# Patient Record
Sex: Female | Born: 1960 | Race: Black or African American | Hispanic: No | Marital: Single | Smoking: Current every day smoker
Health system: Southern US, Community
[De-identification: ages and names within clinical notes are randomized; demographics above are authoritative.]

## PROBLEM LIST (undated history)

## (undated) DIAGNOSIS — I10 Essential (primary) hypertension: Secondary | ICD-10-CM

---

## 2000-09-09 ENCOUNTER — Encounter: Payer: Self-pay | Admitting: Emergency Medicine

## 2000-09-09 ENCOUNTER — Emergency Department (HOSPITAL_COMMUNITY): Admission: EM | Admit: 2000-09-09 | Discharge: 2000-09-09 | Payer: Self-pay | Admitting: Emergency Medicine

## 2001-09-12 ENCOUNTER — Ambulatory Visit (HOSPITAL_COMMUNITY): Admission: RE | Admit: 2001-09-12 | Discharge: 2001-09-12 | Payer: Self-pay | Admitting: Obstetrics

## 2001-10-17 ENCOUNTER — Ambulatory Visit (HOSPITAL_COMMUNITY): Admission: RE | Admit: 2001-10-17 | Discharge: 2001-10-17 | Payer: Self-pay | Admitting: Obstetrics & Gynecology

## 2001-11-05 ENCOUNTER — Inpatient Hospital Stay (HOSPITAL_COMMUNITY): Admission: AD | Admit: 2001-11-05 | Discharge: 2001-11-08 | Payer: Self-pay | Admitting: *Deleted

## 2001-11-05 ENCOUNTER — Encounter (INDEPENDENT_AMBULATORY_CARE_PROVIDER_SITE_OTHER): Payer: Self-pay | Admitting: Specialist

## 2003-09-14 ENCOUNTER — Encounter: Admission: RE | Admit: 2003-09-14 | Discharge: 2003-09-14 | Payer: Self-pay | Admitting: Family Medicine

## 2006-01-22 ENCOUNTER — Emergency Department (HOSPITAL_COMMUNITY): Admission: EM | Admit: 2006-01-22 | Discharge: 2006-01-22 | Payer: Self-pay | Admitting: *Deleted

## 2006-01-29 ENCOUNTER — Emergency Department (HOSPITAL_COMMUNITY): Admission: EM | Admit: 2006-01-29 | Discharge: 2006-01-29 | Payer: Self-pay | Admitting: Emergency Medicine

## 2008-06-15 ENCOUNTER — Emergency Department (HOSPITAL_COMMUNITY): Admission: EM | Admit: 2008-06-15 | Discharge: 2008-06-15 | Payer: Self-pay | Admitting: Family Medicine

## 2011-02-13 NOTE — Discharge Summary (Signed)
[facility]  Patient:    Darlene Taylor, Darlene Taylor Visit Number: [id number] MRN: [id number]          Service Type: OBS Location: 910A 9133 01 Attending Physician:  Michaelle Copas Dictated by:   Lucille Passy, M.D. Admit Date:  11/05/2001 Discharge Date: 11/08/2001                             Discharge Summary  DISCHARGE DIAGNOSES:          1. Repeat cesarean section.                               2. Advanced maternal age.                               3. Mild elevation of blood pressures.                               4. Status post bilateral tubal ligation.  DISCHARGE MEDICATIONS:        1. Ibuprofen 600 mg p.o. q.6h. p.r.n. pain.                               2. Percocet 5/325 mg 1-2 tablets p.o. q.6h.                                  p.r.n. pain.                               3. Prenatal vitamins one p.o. q.d. while                                  breast-feeding.                               4. Pepcid 20 mg p.o. b.i.d.  DISCHARGE INSTRUCTIONS:       1. Pelvic rest for six weeks.                               2. No heavy lifting.                               3. Staples removed by [facility] nurse, 2-3 days following discharge.                               4. Patient to follow up at [facility] in six weeks for routine postpartum check.                               5. Patient encouraged to return for increasing pain, bleeding, fever or for headache, vision changes or worsening stomach pain; as the pain had mildly elevated blood pressures during hospitalization.  BRIEF  HOSPITAL COURSE:        [name] is a 50 year old G5, P1-0-3-1 at 37-0 weeks, who was admitted with SROM and uterine contractions.  She underwent a repeat cesarean section on November 05, 2001 by Dr. Shearon Balo (see dictated operative report).  The patient delivered a viable female infant at [phone] on that day, with Apgars 9 at one minute and 9 at five  minutes.  She also underwent bilateral tubal ligation procedure at that time.  The patient tolerated the procedure well.  Estimated blood loss 800 cc.  Anesthesia was spinal.  The patient was breast-feeding the infant with no problems at the time of discharge.  Hemoglobin was 10.6 on postoperative day #1.  The patient was discharged on postoperative day #3.  Blood pressures were stable during hospitalization and TIH labs were normal during hospitalization.  Blood pressure 122/80 on the day of discharge.  The patient did complain of some epigastric pain during hospitalization, but this was almost certainly reflux; thus, the patient will be discharged on Pepcid.  OBSTETRICAL HISTORY:          1983 therapeutic abortion, 1989 C-section at 40 weeks for arrest, 1992 spontaneous abortion, 1993 spontaneous abortion.  GYNECOLOGICAL HISTORY:        History of Trichomonas, history of gonorrhea treated in 1993.  MEDICAL HISTORY:              Anemia.  SURGICAL HISTORY:             Per OB history, otherwise none.  SOCIAL HISTORY:               The patient does smoke 10 cigarettes per day. Denies alcohol or drug use. Dictated by:   Lucille Passy, M.D. Attending Physician:  Michaelle Copas DD:  11/24/01 TD:  11/25/01 Job: [id number]

## 2011-02-13 NOTE — Op Note (Signed)
[facility]  Patient:    Darlene Taylor, Darlene Taylor Visit Number: [id number] MRN: [id number]          Service Type: OBS Location: 910A 9145 01 Attending Physician:  Michaelle Copas Dictated by:   Shearon Balo, M.D. Proc. Date: 11/05/01 Admit Date:  11/05/2001   CC:         Conni Elliot, M.D.   Operative Report  PREOPERATIVE DIAGNOSES:       62. A 50 year old black female, G2, P1, at term                                  with spontaneous rupture of membranes.                               2. Previous cesarean section and refusal of                                  vaginal birth after cesarean.                               3. Undesired fertility.  POSTOPERATIVE DIAGNOSES:      2. A 50 year old black female, G2, P1, at term                                  with spontaneous rupture of membranes.                               2. Previous cesarean section and refusal of                                  vaginal birth after cesarean.                               3. Undesired fertility.  PROCEDURE:                    1. Repeat low transverse cesarean section.                               2. Bilateral tubal ligation.  SURGEON:                      Shearon Balo, M.D.  ASSISTANT:                    Conni Elliot, M.D.  ANESTHESIA:                   Spinal.  COMPLICATIONS:                None.  ESTIMATED BLOOD LOSS:         800 cc.  PATHOLOGY:                    Bilateral segments of fallopian tubes.  DISPOSITION:  Recovery room, stable.  DESCRIPTION OF PROCEDURE:     The patient was taken to the operating room. She was given spinal anesthesia. The patient was prepped and draped in sterile fashion. An incision was made with a scalpel through the previous cesarean section scar. The incision was carried down to the underlying fascia with Bovie and the fascia was entered with Bovie. The fascia was then dissected sharply  laterally with Mayo scissors. Fascia was separated from the underlying rectus muscle bellies with sharp and blunt dissection. The rectus muscle bellies were separated and the peritoneum was entered sharply. The peritoneum was dissected both superiorly and inferiorly. The bladder blade was placed and a bladder flap was created with sharp and blunt dissection. The uterus was scored with the scalpel and the uterus was entered in the midline with sharp dissection. The amniotic fluid was clear and the surgeons fingers were used to extend the incision laterally. The infants head was delivered and bulb suctioned in the operative field. The infants body was delivered and the cord was clamped and cut. The placenta was then manually extracted and the uterus was curetted with a dry lap sponge. The uterus was externalized and the uterine incision was repaired with a running locking stitch of 0 chromic. Figure-of-eight sutures were then placed to control any additional oozing. The uterine incision was noted to be hemostatic and attention was then turned to the fallopian tubes. The fallopian tubes were grasped with the Babcock clamp and ligated with two sutures of 2-0 chromic. A knuckle of tube was formed and the intervening section was excised. This was sent to pathology. Telescoping ends of the tubes were noted bilaterally. The tubes were noted to be hemostatic. The uterus was returned to the abdomen and the uterine incision was once again inspected. It was noted to be hemostatic. The abdomen was irrigated with warm saline. The uterine incision was once again inspected. The fascia was then closed with a running stitch of 0 Vicryl. The subcutaneous tissues were irrigated with copious amounts of saline and the skin was reapproximated with staples. The patient was returned to the recovery room in stable condition. Dictated by:   Shearon Balo, M.D. Attending Physician:  Michaelle Copas DD:   11/05/01 TD:  11/06/01 Job: [id number]

## 2012-07-22 ENCOUNTER — Other Ambulatory Visit (HOSPITAL_COMMUNITY)
Admission: RE | Admit: 2012-07-22 | Discharge: 2012-07-22 | Disposition: A | Discharge status: Home / Self Care | Payer: Medicaid Other | Source: Ambulatory Visit | Attending: Family Medicine | Admitting: Family Medicine

## 2012-07-22 DIAGNOSIS — Z01419 Encounter for gynecological examination (general) (routine) without abnormal findings: Secondary | ICD-10-CM | POA: Insufficient documentation

## 2012-07-22 DIAGNOSIS — Z1151 Encounter for screening for human papillomavirus (HPV): Secondary | ICD-10-CM | POA: Insufficient documentation

## 2014-03-23 ENCOUNTER — Emergency Department (INDEPENDENT_AMBULATORY_CARE_PROVIDER_SITE_OTHER): Payer: Medicaid Other

## 2014-03-23 ENCOUNTER — Emergency Department (HOSPITAL_COMMUNITY)
Admission: EM | Admit: 2014-03-23 | Discharge: 2014-03-23 | Disposition: A | Discharge status: Home / Self Care | Payer: Medicaid Other | Source: Home / Self Care

## 2014-03-23 ENCOUNTER — Encounter (HOSPITAL_COMMUNITY): Payer: Self-pay | Admitting: Emergency Medicine

## 2014-03-23 ENCOUNTER — Emergency Department (HOSPITAL_COMMUNITY): Payer: Medicaid Other

## 2014-03-23 DIAGNOSIS — S62609A Fracture of unspecified phalanx of unspecified finger, initial encounter for closed fracture: Secondary | ICD-10-CM

## 2014-03-23 DIAGNOSIS — W1809XA Striking against other object with subsequent fall, initial encounter: Secondary | ICD-10-CM

## 2014-03-23 HISTORY — DX: Essential (primary) hypertension: I10

## 2014-03-23 MED ORDER — HYDROCODONE-ACETAMINOPHEN 5-325 MG PO TABS: 1.0000 | ORAL_TABLET | ORAL | Status: DC | PRN

## 2014-03-23 NOTE — ED Provider Notes (Signed)
CSN: [id number]     Arrival date & time 03/23/14  1439 History   First MD Initiated Contact with Patient 03/23/14 1502     Chief Complaint  Patient presents with  . Hand Injury   (Consider location/radiation/quality/duration/timing/severity/associated sxs/prior Treatment) HPI Comments: 53 year old female states she fell on to a mattress landing on her left hand. As a result she suffered an injury to the left ring finger. Denies injury to the associated digits, hand or wrist.   Past Medical History  Diagnosis Date  . Hypertension    History reviewed. No pertinent past surgical history. History reviewed. No pertinent family history. History  Substance Use Topics  . Smoking status: Current Every Day Smoker  . Smokeless tobacco: Not on file  . Alcohol Use: No   OB History   Grav Para Term Preterm Abortions TAB SAB Ect Mult Living                 Review of Systems  Constitutional: Negative for fever and activity change.  HENT: Negative.   Respiratory: Negative.   Cardiovascular: Negative.   Musculoskeletal: Negative for back pain, neck pain and neck stiffness.       As per HPI  Skin: Negative for color change and pallor.  Neurological: Negative.     Allergies  Review of patient's allergies indicates no known allergies.  Home Medications   Prior to Admission medications   Medication Sig Start Date End Date Taking? Authorizing Provider  lisinopril (PRINIVIL,ZESTRIL) 5 MG tablet Take 5 mg by mouth daily.   Yes Historical Provider, MD  HYDROcodone-acetaminophen (NORCO/VICODIN) 5-325 MG per tablet Take 1 tablet by mouth every 4 (four) hours as needed. 03/23/14   Janne Napoleon, NP   BP 158/105  Pulse 82  Temp(Src) 98.8 F (37.1 C) (Oral)  Resp 16  SpO2 98%  LMP 03/12/2014 Physical Exam  Nursing note and vitals reviewed. Constitutional: She is oriented to person, place, and time. She appears well-developed and well-nourished. No distress.  HENT:  Head: Normocephalic and  atraumatic.  Eyes: EOM are normal. Pupils are equal, round, and reactive to light.  Neck: Normal range of motion. Neck supple.  Pulmonary/Chest: Effort normal. No respiratory distress.  Musculoskeletal:  Swelling and ecchymosis to the length of the left ring finger. There is deformity to the middle phalanx along with tenderness. Tenderness to the PIP. Sensation is intact. Unable to flex the digit but can maintain partial extension. Flexion and extension of the MCP is intact. Capillary refill is less than 2 seconds.   Neurological: She is alert and oriented to person, place, and time. No cranial nerve deficit.  Skin: Skin is warm and dry.    ED Course  ORTHOPEDIC INJURY TREATMENT Date/Time: 03/23/2014 4:45 PM Performed by: Marcha Dutton, DAVID Authorized by: Lynne Leader, S Consent: Verbal consent obtained. Risks and benefits: risks, benefits and alternatives were discussed Consent given by: patient Patient understanding: patient states understanding of the procedure being performed Patient identity confirmed: verbally with patient Injury location: finger Location details: left ring finger Injury type: fracture Fracture type: middle phalanx MCP joint involved: no IP joint involved: no Pre-procedure distal perfusion: normal Pre-procedure neurological function: normal Pre-procedure range of motion: reduced Local anesthesia used: yes Anesthesia: digital block Local anesthetic: bupivacaine 0.25% without epinephrine Anesthetic total: 4 ml Patient sedated: no Manipulation performed: yes Skin traction used: no Skeletal traction used: yes Reduction successful: yes X-ray confirmed reduction: yes Immobilization: splint Splint type: static finger Supplies used: aluminum splint and elastic bandage  Post-procedure neurovascular assessment: post-procedure neurovascularly intact Post-procedure distal perfusion: normal Post-procedure neurological function: normal Post-procedure range of motion:  improved Patient tolerance: Patient tolerated the procedure well with no immediate complications. Comments: Complete anesthesia obtained with digital block. Traction and ulnar rotation provided improved alignment.   (including critical care time) Labs Review Labs Reviewed - No data to display  Imaging Review Dg Hand Complete Left  03/23/2014   CLINICAL DATA:  Fourth digit fracture status post manipulation  EXAM: LEFT HAND - COMPLETE 3+ VIEW  COMPARISON:  Film from earlier in the same day  FINDINGS: There is been reduction of the fourth middle phalangeal fracture significantly with the fracture fragments in near anatomic alignment. The twisting nature of the soft tissues has been reduced as well. No other fracture is seen. No other focal abnormality is noted.  IMPRESSION: Reduction of the fourth middle phalangeal fracture.   Electronically Signed   By: Inez Catalina M.D.   On: 03/23/2014 17:03   Dg Hand Complete Left  03/23/2014   CLINICAL DATA:  Fall with deformity  EXAM: LEFT HAND - COMPLETE 3+ VIEW  COMPARISON:  None.  FINDINGS: The only abnormality relates to the middle phalanx of the ring finger. There is an oblique fracture through the midportion of this bone with twisting, radial angulation and slight ventral angulation. No intra-articular extension.  IMPRESSION: Oblique fracture through the midportion of the middle phalanx of the ring finger with twisting and angulation.   Electronically Signed   By: Nelson Chimes M.D.   On: 03/23/2014 15:13     MDM   1. Finger fracture, left, closed, initial encounter    Spoke with Dr. Gara Kroner via phone and he requested manual realignment,     Buddy tape the digit to the adjacent finger and apply splint. She will keep it elevated and apply ice. She will see Dr. Apolonio Schneiders on Tuesday at 8:00 AM. Norco 5 mg #20 third   Janne Napoleon, NP 03/23/14 [place], NP 03/23/14 1726

## 2014-03-23 NOTE — Discharge Instructions (Signed)
Finger Fracture °Fractures of fingers are breaks in the bones of the fingers. There are many types of fractures. There are different ways of treating these fractures. Your health care provider will discuss the best way to treat your fracture. °CAUSES °Traumatic injury is the main cause of broken fingers. These include: °· Injuries while playing sports. °· Workplace injuries. °· Falls. °RISK FACTORS °Activities that can increase your risk of finger fractures include: °· Sports. °· Workplace activities that involve machinery. °· A condition called osteoporosis, which can make your bones less dense and cause them to fracture more easily. °SIGNS AND SYMPTOMS °The main symptoms of a broken finger are pain and swelling within 15 minutes after the injury. Other symptoms include: °· Bruising of your finger. °· Stiffness of your finger. °· Numbness of your finger. °· Exposed bones (compound fracture) if the fracture is severe. °DIAGNOSIS  °The best way to diagnose a broken bone is with X-ray imaging. Additionally, your health care provider will use this X-ray image to evaluate the position of the broken finger bones.  °TREATMENT  °Finger fractures can be treated with:  °· Nonreduction--This means the bones are in place. The finger is splinted without changing the positions of the bone pieces. The splint is usually left on for about a week to 10 days. This will depend on your fracture and what your health care provider thinks. °· Closed reduction--The bones are put back into position without using surgery. The finger is then splinted. °· Open reduction and internal fixation--The fracture site is opened. Then the bone pieces are fixed into place with pins or some type of hardware. This is seldom required. It depends on the severity of the fracture. °HOME CARE INSTRUCTIONS  °· Follow your health care provider's instructions regarding activities, exercises, and physical therapy. °· Only take over-the-counter or prescription  medicines for pain, discomfort, or fever as directed by your health care provider. °SEEK MEDICAL CARE IF: °You have pain or swelling that limits the motion or use of your fingers. °SEEK IMMEDIATE MEDICAL CARE IF:  °Your finger becomes numb. °MAKE SURE YOU:  °· Understand these instructions. °· Will watch your condition. °· Will get help right away if you are not doing well or get worse. °Document Released: 12/27/2000 Document Revised: 07/05/2013 Document Reviewed: 04/26/2013 °[employer]® Patient Information ©2015 [employer]. This information is not intended to replace advice given to you by your health care provider. Make sure you discuss any questions you have with your health care provider. ° °Cast or Splint Care °Casts and splints support injured limbs and keep bones from moving while they heal. It is important to care for your cast or splint at home.   °HOME CARE INSTRUCTIONS °· Keep the cast or splint uncovered during the drying period. It can take 24 to 48 hours to dry if it is made of plaster. A fiberglass cast will dry in less than 1 hour. °· Do not rest the cast on anything harder than a pillow for the first 24 hours. °· Do not put weight on your injured limb or apply pressure to the cast until your health care provider gives you permission. °· Keep the cast or splint dry. Wet casts or splints can lose their shape and may not support the limb as well. A wet cast that has lost its shape can also create harmful pressure on your skin when it dries. Also, wet skin can become infected. °¨ Cover the cast or splint with a plastic bag when bathing   or when out in the rain or snow. If the cast is on the trunk of the body, take sponge baths until the cast is removed.  If your cast does become wet, dry it with a towel or a blow dryer on the cool setting only.  Keep your cast or splint clean. Soiled casts may be wiped with a moistened cloth.  Do not place any hard or soft foreign objects under your cast or  splint, such as cotton, toilet paper, lotion, or powder.  Do not try to scratch the skin under the cast with any object. The object could get stuck inside the cast. Also, scratching could lead to an infection. If itching is a problem, use a blow dryer on a cool setting to relieve discomfort.  Do not trim or cut your cast or remove padding from inside of it.  Exercise all joints next to the injury that are not immobilized by the cast or splint. For example, if you have a long leg cast, exercise the hip joint and toes. If you have an arm cast or splint, exercise the shoulder, elbow, thumb, and fingers.  Elevate your injured arm or leg on 1 or 2 pillows for the first 1 to 3 days to decrease swelling and pain.It is best if you can comfortably elevate your cast so it is higher than your heart. SEEK MEDICAL CARE IF:   Your cast or splint cracks.  Your cast or splint is too tight or too loose.  You have unbearable itching inside the cast.  Your cast becomes wet or develops a soft spot or area.  You have a bad smell coming from inside your cast.  You get an object stuck under your cast.  Your skin around the cast becomes red or raw.  You have new pain or worsening pain after the cast has been applied. SEEK IMMEDIATE MEDICAL CARE IF:   You have fluid leaking through the cast.  You are unable to move your fingers or toes.  You have discolored (blue or white), cool, painful, or very swollen fingers or toes beyond the cast.  You have tingling or numbness around the injured area.  You have severe pain or pressure under the cast.  You have any difficulty with your breathing or have shortness of breath.  You have chest pain. Document Released: 09/11/2000 Document Revised: 07/05/2013 Document Reviewed: 03/23/2013 [facility] Patient Information 2015 [place], [place]. This information is not intended to replace advice given to you by your health care provider. Make sure you discuss any  questions you have with your health care provider.  Finger Fracture A finger fracture is when one or more bones in the finger break.  HOME CARE   Wear the splint, tape, or cast as long as told by your doctor.  Keep your fingers in the position your doctor tell you to.  Raise (elevate) the injured area above the level of the heart.  Only take medicine as told by your doctor.  Put ice on the injured area.  Put ice in a plastic bag.  Place a towel between the skin and the bag.  Leave the ice on for 15-20 minutes, 03-04 times a day.  Follow up with your doctor.  Ask what exercises you can do when the splint comes off. GET HELP RIGHT AWAY IF:   The fingernails are white or bluish.  You have pain not helped by medicine.  You cannot move your fingertips.  You lose feeling (numbness) in the  injured finger(s). MAKE SURE YOU:   Understand these instructions.  Will watch this condition.  Will get help right away if you are not doing well or get worse. Document Released: 03/02/2008 Document Revised: 12/07/2011 Document Reviewed: 03/02/2008 [facility] Patient Information 2015 [place], [place]. This information is not intended to replace advice given to you by your health care provider. Make sure you discuss any questions you have with your health care provider.

## 2014-03-23 NOTE — ED Notes (Signed)
C/o she has been having numbness in her hands for past few months, so she does not feel pain in her fingers unless she tries to move them. Deformity noted ring finger, pain in ring finger w touch or attempted movement. Skin intact

## 2014-03-27 NOTE — ED Provider Notes (Signed)
Following digital nerve block  Traction and rotational forces applied to the finger achieving good anatomical alignment. Postreduction x-rays showed good alignment. The 3-5 digits were then placed into an ulnar gutter splint. Patient is to followup with hand surgery as directed.   Medical screening examination/treatment/procedure(s) were performed by a resident physician or non-physician practitioner and as the supervising physician I was immediately available for consultation/collaboration.  Lynne Leader, MD    Gregor Hams, MD 03/27/14 [phone]

## 2014-04-11 ENCOUNTER — Ambulatory Visit: Payer: Medicaid Other | Attending: Orthopedic Surgery | Admitting: Occupational Therapy

## 2014-04-11 DIAGNOSIS — IMO0001 Reserved for inherently not codable concepts without codable children: Secondary | ICD-10-CM | POA: Insufficient documentation

## 2014-04-11 DIAGNOSIS — M25649 Stiffness of unspecified hand, not elsewhere classified: Secondary | ICD-10-CM | POA: Diagnosis not present

## 2014-04-11 DIAGNOSIS — M25549 Pain in joints of unspecified hand: Secondary | ICD-10-CM | POA: Insufficient documentation

## 2014-05-08 ENCOUNTER — Ambulatory Visit: Payer: Medicaid Other | Attending: Orthopedic Surgery | Admitting: Occupational Therapy

## 2014-05-08 DIAGNOSIS — IMO0001 Reserved for inherently not codable concepts without codable children: Secondary | ICD-10-CM | POA: Diagnosis not present

## 2014-05-08 DIAGNOSIS — M25549 Pain in joints of unspecified hand: Secondary | ICD-10-CM | POA: Diagnosis not present

## 2014-05-08 DIAGNOSIS — M25649 Stiffness of unspecified hand, not elsewhere classified: Secondary | ICD-10-CM | POA: Diagnosis not present

## 2014-05-14 ENCOUNTER — Ambulatory Visit: Payer: Medicaid Other | Admitting: Occupational Therapy

## 2014-05-14 DIAGNOSIS — IMO0001 Reserved for inherently not codable concepts without codable children: Secondary | ICD-10-CM | POA: Diagnosis not present

## 2014-05-28 ENCOUNTER — Emergency Department (HOSPITAL_COMMUNITY)
Admission: EM | Admit: 2014-05-28 | Discharge: 2014-05-28 | Disposition: A | Discharge status: Home / Self Care | Payer: Medicaid Other | Source: Home / Self Care

## 2016-10-28 DIAGNOSIS — I10 Essential (primary) hypertension: Secondary | ICD-10-CM | POA: Diagnosis not present

## 2016-10-28 DIAGNOSIS — E28310 Symptomatic premature menopause: Secondary | ICD-10-CM | POA: Diagnosis not present

## 2016-11-12 ENCOUNTER — Ambulatory Visit (HOSPITAL_COMMUNITY)
Admission: EM | Admit: 2016-11-12 | Discharge: 2016-11-12 | Disposition: A | Discharge status: Home / Self Care | Payer: Commercial Managed Care - HMO | Attending: Internal Medicine | Admitting: Internal Medicine

## 2016-11-12 ENCOUNTER — Encounter (HOSPITAL_COMMUNITY): Payer: Self-pay | Admitting: *Deleted

## 2016-11-12 DIAGNOSIS — K047 Periapical abscess without sinus: Secondary | ICD-10-CM | POA: Diagnosis not present

## 2016-11-12 MED ORDER — HYDROCODONE-ACETAMINOPHEN 5-325 MG PO TABS: 1.0000 | ORAL_TABLET | Freq: Four times a day (QID) | ORAL | 0 refills | Status: DC | PRN

## 2016-11-12 MED ORDER — CLINDAMYCIN HCL 300 MG PO CAPS: 300.0000 mg | ORAL_CAPSULE | Freq: Three times a day (TID) | ORAL | 0 refills | Status: AC

## 2016-11-12 NOTE — Discharge Instructions (Signed)
You have a dental abscess. I advise you to keep your dental appointment on Monday. In the meantime I have prescribed clindamycin. Take one tablet 3 times a day. This medicine can be rough on her stomach, so advised you eat yogurt or other foods containing probiotics. I have also prescribed a pain medication called hydrocodone. This medication is a narcotic, and it is addictive. This medication will cause drowsiness and advised to not drink any alcohol, and do not drive her vehicle or operate heavy machinery while taking. Should she not go to dentist Monday we will be unable to refill this narcotic medication so I strongly urge you to keep your appointment.

## 2016-11-12 NOTE — ED Triage Notes (Signed)
Patient reports bilateral lower dental pain, dentist appotint Monday.

## 2016-11-12 NOTE — ED Provider Notes (Signed)
CSN: [id number]     Arrival date & time 11/12/16  1555 History   First MD Initiated Contact with Patient 11/12/16 1717     Chief Complaint  Patient presents with  . Dental Pain   (Consider location/radiation/quality/duration/timing/severity/associated sxs/prior Treatment) 56 year old female reports to clinic with 3 day history of lower jaw pain and tooth pain. States she has a dental abscess, states she has a appointment with her dentist scheduled for this coming Monday. She denies any fever nausea vomiting or any signs or symptoms of systemic illness. She has been taking Tylenol and ibuprofen without relief.   The history is provided by the patient.  Dental Pain    Past Medical History:  Diagnosis Date  . Hypertension    History reviewed. No pertinent surgical history. History reviewed. No pertinent family history. Social History  Substance Use Topics  . Smoking status: Current Every Day Smoker  . Smokeless tobacco: Never Used  . Alcohol use No   OB History    No data available     Review of Systems  Reason unable to perform ROS: as covered in HPI.  All other systems reviewed and are negative.   Allergies  Patient has no known allergies.  Home Medications   Prior to Admission medications   Medication Sig Start Date End Date Taking? Authorizing Provider  amlodipine-atorvastatin (CADUET) 10-10 MG tablet Take 1 tablet by mouth daily.   Yes Historical Provider, MD  clonazePAM (KLONOPIN) 1 MG tablet Take 1 mg by mouth 2 (two) times daily.   Yes Historical Provider, MD  valsartan-hydrochlorothiazide (DIOVAN-HCT) 160-12.5 MG tablet Take 1 tablet by mouth daily.   Yes Historical Provider, MD  clindamycin (CLEOCIN) 300 MG capsule Take 1 capsule (300 mg total) by mouth 3 (three) times daily. 11/12/16 11/19/16  Barnet Glasgow, NP  HYDROcodone-acetaminophen (NORCO/VICODIN) 5-325 MG tablet Take 1 tablet by mouth every 6 (six) hours as needed. 11/12/16   Barnet Glasgow, NP   lisinopril (PRINIVIL,ZESTRIL) 5 MG tablet Take 5 mg by mouth daily.    Historical Provider, MD   Meds Ordered and Administered this Visit  Medications - No data to display  There were no vitals taken for this visit. No data found.   Physical Exam  Constitutional: She is oriented to person, place, and time. She appears well-developed and well-nourished. No distress.  HENT:  Head: Normocephalic and atraumatic.  1 cm x 1 cm apparent abscess located beneath the 26th 27th tooth.  Neck: Normal range of motion. Neck supple. No JVD present.  Cardiovascular: Normal rate and regular rhythm.   Pulmonary/Chest: Effort normal and breath sounds normal.  Lymphadenopathy:       Head (right side): No submental, no submandibular, no tonsillar and no preauricular adenopathy present.       Head (left side): No submental, no submandibular, no tonsillar and no preauricular adenopathy present.    She has no cervical adenopathy.  Neurological: She is alert and oriented to person, place, and time.  Skin: Skin is warm and dry. Capillary refill takes less than 2 seconds. She is not diaphoretic.  Nursing note and vitals reviewed.   Urgent Care Course     Procedures (including critical care time)  Labs Review Labs Reviewed - No data to display  Imaging Review No results found.   Visual Acuity Review  Right Eye Distance:   Left Eye Distance:   Bilateral Distance:    Right Eye Near:   Left Eye Near:    Bilateral Near:  MDM   1. Dental abscess   You have a dental abscess. I advise you to keep your dental appointment on Monday. In the meantime I have prescribed clindamycin. Take one tablet 3 times a day. This medicine can be rough on her stomach, so advised you eat yogurt or other foods containing probiotics. I have also prescribed a pain medication called hydrocodone. This medication is a narcotic, and it is addictive. This medication will cause drowsiness and advised to not drink any  alcohol, and do not drive her vehicle or operate heavy machinery while taking. Should she not go to dentist Monday we will be unable to refill this narcotic medication so I strongly urge you to keep your appointment.  [place] controlled substances reporting system consulted prior to issuing prescription. No controlled substances were written for this patient in the last 6 months.     Barnet Glasgow, NP 11/12/16 1731

## 2016-12-01 DIAGNOSIS — Z1211 Encounter for screening for malignant neoplasm of colon: Secondary | ICD-10-CM | POA: Diagnosis not present

## 2016-12-11 DIAGNOSIS — I10 Essential (primary) hypertension: Secondary | ICD-10-CM | POA: Diagnosis not present

## 2017-02-19 DIAGNOSIS — D125 Benign neoplasm of sigmoid colon: Secondary | ICD-10-CM | POA: Diagnosis not present

## 2017-02-19 DIAGNOSIS — Z1211 Encounter for screening for malignant neoplasm of colon: Secondary | ICD-10-CM | POA: Diagnosis not present

## 2017-02-19 DIAGNOSIS — K635 Polyp of colon: Secondary | ICD-10-CM | POA: Diagnosis not present

## 2017-04-21 DIAGNOSIS — Z1231 Encounter for screening mammogram for malignant neoplasm of breast: Secondary | ICD-10-CM | POA: Diagnosis not present

## 2017-05-14 DIAGNOSIS — L600 Ingrowing nail: Secondary | ICD-10-CM | POA: Diagnosis not present

## 2017-05-14 DIAGNOSIS — I10 Essential (primary) hypertension: Secondary | ICD-10-CM | POA: Diagnosis not present

## 2017-10-30 DIAGNOSIS — H40033 Anatomical narrow angle, bilateral: Secondary | ICD-10-CM | POA: Diagnosis not present

## 2017-10-30 DIAGNOSIS — H04123 Dry eye syndrome of bilateral lacrimal glands: Secondary | ICD-10-CM | POA: Diagnosis not present

## 2018-04-26 DIAGNOSIS — Z1231 Encounter for screening mammogram for malignant neoplasm of breast: Secondary | ICD-10-CM | POA: Diagnosis not present

## 2018-05-27 DIAGNOSIS — R922 Inconclusive mammogram: Secondary | ICD-10-CM | POA: Diagnosis not present

## 2018-05-27 DIAGNOSIS — R928 Other abnormal and inconclusive findings on diagnostic imaging of breast: Secondary | ICD-10-CM | POA: Diagnosis not present

## 2019-12-08 ENCOUNTER — Ambulatory Visit: Payer: 59 | Attending: Internal Medicine

## 2019-12-08 DIAGNOSIS — Z23 Encounter for immunization: Secondary | ICD-10-CM

## 2019-12-08 NOTE — Progress Notes (Signed)
   Covid-19 Vaccination Clinic  Name:  Darlene Taylor    MRN: [id number] DOB: 1961/05/16  12/08/2019  [name] was observed post Covid-19 immunization for 15 minutes without incident. She was provided with Vaccine Information Sheet and instruction to access the V-Safe system.   [name] was instructed to call 911 with any severe reactions post vaccine: Marland Kitchen Difficulty breathing  . Swelling of face and throat  . A fast heartbeat  . A bad rash all over body  . Dizziness and weakness   Immunizations Administered    Name Date Dose VIS Date Route   Pfizer COVID-19 Vaccine 12/08/2019 11:46 AM 0.3 mL 09/08/2019 Intramuscular   Manufacturer: [place]   Lot: [id number]   [place]: [id number]

## 2020-01-01 ENCOUNTER — Ambulatory Visit: Payer: 59 | Attending: Internal Medicine

## 2020-01-01 DIAGNOSIS — Z23 Encounter for immunization: Secondary | ICD-10-CM

## 2020-01-01 NOTE — Progress Notes (Signed)
   Covid-19 Vaccination Clinic  Name:  Darlene Taylor    MRN: [id number] DOB: 03/16/1961  01/01/2020  [name] was observed post Covid-19 immunization for 15 minutes without incident. She was provided with Vaccine Information Sheet and instruction to access the V-Safe system.   [name] was instructed to call 911 with any severe reactions post vaccine: Marland Kitchen Difficulty breathing  . Swelling of face and throat  . A fast heartbeat  . A bad rash all over body  . Dizziness and weakness   Immunizations Administered    Name Date Dose VIS Date Route   Pfizer COVID-19 Vaccine 01/01/2020  3:12 PM 0.3 mL 09/08/2019 Intramuscular   Manufacturer: [place]   Lot: [id number]   [place]: [id number]

## 2022-03-11 ENCOUNTER — Emergency Department (HOSPITAL_COMMUNITY)
Admission: EM | Admit: 2022-03-11 | Discharge: 2022-03-11 | Disposition: A | Discharge status: Home / Self Care | Payer: 59 | Attending: Emergency Medicine | Admitting: Emergency Medicine

## 2022-03-11 ENCOUNTER — Encounter (HOSPITAL_COMMUNITY): Payer: Self-pay

## 2022-03-11 ENCOUNTER — Other Ambulatory Visit: Payer: Self-pay

## 2022-03-11 ENCOUNTER — Emergency Department (HOSPITAL_COMMUNITY): Payer: 59

## 2022-03-11 DIAGNOSIS — Y92410 Unspecified street and highway as the place of occurrence of the external cause: Secondary | ICD-10-CM | POA: Diagnosis not present

## 2022-03-11 DIAGNOSIS — R109 Unspecified abdominal pain: Secondary | ICD-10-CM | POA: Insufficient documentation

## 2022-03-11 DIAGNOSIS — I10 Essential (primary) hypertension: Secondary | ICD-10-CM | POA: Diagnosis not present

## 2022-03-11 DIAGNOSIS — Z20822 Contact with and (suspected) exposure to covid-19: Secondary | ICD-10-CM | POA: Diagnosis not present

## 2022-03-11 DIAGNOSIS — R079 Chest pain, unspecified: Secondary | ICD-10-CM | POA: Insufficient documentation

## 2022-03-11 DIAGNOSIS — S52591A Other fractures of lower end of right radius, initial encounter for closed fracture: Secondary | ICD-10-CM | POA: Diagnosis not present

## 2022-03-11 DIAGNOSIS — E041 Nontoxic single thyroid nodule: Secondary | ICD-10-CM | POA: Insufficient documentation

## 2022-03-11 DIAGNOSIS — Z23 Encounter for immunization: Secondary | ICD-10-CM | POA: Insufficient documentation

## 2022-03-11 DIAGNOSIS — M542 Cervicalgia: Secondary | ICD-10-CM | POA: Insufficient documentation

## 2022-03-11 DIAGNOSIS — M79631 Pain in right forearm: Secondary | ICD-10-CM | POA: Insufficient documentation

## 2022-03-11 DIAGNOSIS — I7090 Unspecified atherosclerosis: Secondary | ICD-10-CM | POA: Diagnosis not present

## 2022-03-11 DIAGNOSIS — S0990XA Unspecified injury of head, initial encounter: Secondary | ICD-10-CM | POA: Diagnosis not present

## 2022-03-11 DIAGNOSIS — S6991XA Unspecified injury of right wrist, hand and finger(s), initial encounter: Secondary | ICD-10-CM | POA: Diagnosis present

## 2022-03-11 DIAGNOSIS — Z79899 Other long term (current) drug therapy: Secondary | ICD-10-CM | POA: Diagnosis not present

## 2022-03-11 LAB — COMPREHENSIVE METABOLIC PANEL
ALT: 12 U/L (ref 0–44)
AST: 19 U/L (ref 15–41)
Albumin: 3.5 g/dL (ref 3.5–5.0)
Alkaline Phosphatase: 92 U/L (ref 38–126)
Anion gap: 6 (ref 5–15)
BUN: 9 mg/dL (ref 6–20)
CO2: 24 mmol/L (ref 22–32)
Calcium: 8.6 mg/dL — ABNORMAL LOW (ref 8.9–10.3)
Chloride: 108 mmol/L (ref 98–111)
Creatinine, Ser: 0.68 mg/dL (ref 0.44–1.00)
GFR, Estimated: >60 mL/min (ref >60)
Glucose, Bld: 130 mg/dL — ABNORMAL HIGH (ref 70–99)
Potassium: 3.7 mmol/L (ref 3.5–5.1)
Sodium: 138 mmol/L (ref 135–145)
Total Bilirubin: 0.5 mg/dL (ref 0.3–1.2)
Total Protein: 6.5 g/dL (ref 6.5–8.1)

## 2022-03-11 LAB — CBC
HCT: 38.4 % (ref 36.0–46.0)
Hemoglobin: 13.1 g/dL (ref 12.0–15.0)
MCH: 31.7 pg (ref 26.0–34.0)
MCHC: 34.1 g/dL (ref 30.0–36.0)
MCV: 93 fL (ref 80.0–100.0)
Platelets: 300 10*3/uL (ref 150–400)
RBC: 4.13 MIL/uL (ref 3.87–5.11)
RDW: 14.6 % (ref 11.5–15.5)
WBC: 9.2 10*3/uL (ref 4.0–10.5)
nRBC: 0 % (ref 0.0–0.2)

## 2022-03-11 LAB — RESP PANEL BY RT-PCR (FLU A&B, COVID) ARPGX2
Influenza A by PCR: NEGATIVE
Influenza B by PCR: NEGATIVE
SARS Coronavirus 2 by RT PCR: NEGATIVE

## 2022-03-11 MED ORDER — ONDANSETRON HCL 4 MG/2ML IJ SOLN
4.0000 mg | Freq: Once | INTRAMUSCULAR | Status: DC
  Filled 2022-03-11: qty 2

## 2022-03-11 MED ORDER — HYDROCODONE-ACETAMINOPHEN 5-325 MG PO TABS: 1.0000 | ORAL_TABLET | ORAL | 0 refills | Status: AC | PRN

## 2022-03-11 MED ORDER — MORPHINE SULFATE (PF) 4 MG/ML IV SOLN
4.0000 mg | Freq: Once | INTRAVENOUS | Status: AC
  Administered 2022-03-11: 4 mg via INTRAVENOUS
  Filled 2022-03-11 (×2): qty 1

## 2022-03-11 MED ORDER — IBUPROFEN 600 MG PO TABS
600.0000 mg | ORAL_TABLET | Freq: Four times a day (QID) | ORAL | 0 refills | Status: AC | PRN
Start: 2022-03-11 — End: ?

## 2022-03-11 MED ORDER — SODIUM CHLORIDE 0.9 % IV SOLN: INTRAVENOUS | Status: DC

## 2022-03-11 MED ORDER — TETANUS-DIPHTH-ACELL PERTUSSIS 5-2.5-18.5 LF-MCG/0.5 IM SUSY
0.5000 mL | PREFILLED_SYRINGE | Freq: Once | INTRAMUSCULAR | Status: AC
  Administered 2022-03-11: 0.5 mL via INTRAMUSCULAR
  Filled 2022-03-11: qty 0.5

## 2022-03-11 MED ORDER — IOHEXOL 300 MG/ML  SOLN
100.0000 mL | Freq: Once | INTRAMUSCULAR | Status: AC | PRN
  Administered 2022-03-11: 100 mL via INTRAVENOUS

## 2022-03-11 MED ORDER — SODIUM CHLORIDE 0.9 % IV BOLUS: 1000.0000 mL | Freq: Once | INTRAVENOUS | Status: DC
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
## 2022-03-11 NOTE — Discharge Instructions (Addendum)
It is very important that you follow up with your doctor.  You need a routine physical and labs to check your cholesterol and thyroid.  You also need an outpatient thyroid ultrasound to further evaluate your thyroid nodule.
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
## 2022-03-11 NOTE — ED Notes (Signed)
Pt to xray

## 2022-03-11 NOTE — ED Notes (Signed)
Pt c/o numbness in right arm. Pt has 2+ right radial pulse, cap refill less than 3 sec, warm to touch, pt able to wiggle fingers. Pt able to move all extremities.

## 2022-03-11 NOTE — Progress Notes (Signed)
Orthopedic Tech Progress Note Patient Details:  Darlene Taylor 11-04-1960 [id number]  Ortho Devices Type of Ortho Device: Cotton web roll, Volar splint Ortho Device/Splint Location: RUE Ortho Device/Splint Interventions: Ordered, Application   Post Interventions Patient Tolerated: Well Instructions Provided: Care of device  Janit Pagan 03/11/2022, 5:59 PM

## 2022-03-17 ENCOUNTER — Other Ambulatory Visit: Payer: Self-pay | Admitting: Family Medicine

## 2022-03-17 DIAGNOSIS — E0789 Other specified disorders of thyroid: Secondary | ICD-10-CM

## 2022-03-18 ENCOUNTER — Other Ambulatory Visit: Payer: 59

## 2022-03-20 ENCOUNTER — Ambulatory Visit
Admission: RE | Admit: 2022-03-20 | Discharge: 2022-03-20 | Disposition: A | Discharge status: Home / Self Care | Payer: 59 | Source: Ambulatory Visit | Attending: Family Medicine | Admitting: Family Medicine

## 2022-03-20 DIAGNOSIS — E0789 Other specified disorders of thyroid: Secondary | ICD-10-CM

## 2022-03-21 ENCOUNTER — Other Ambulatory Visit: Payer: Self-pay

## 2022-03-21 DIAGNOSIS — I739 Peripheral vascular disease, unspecified: Secondary | ICD-10-CM

## 2022-03-26 ENCOUNTER — Ambulatory Visit (HOSPITAL_COMMUNITY)
Admission: RE | Admit: 2022-03-26 | Discharge: 2022-03-26 | Disposition: A | Discharge status: Home / Self Care | Payer: 59 | Source: Ambulatory Visit | Attending: Surgery | Admitting: Surgery

## 2022-03-26 DIAGNOSIS — I739 Peripheral vascular disease, unspecified: Secondary | ICD-10-CM | POA: Insufficient documentation

## 2022-03-27 ENCOUNTER — Encounter: Payer: 59 | Admitting: Surgery

## 2022-04-06 ENCOUNTER — Other Ambulatory Visit: Payer: Self-pay | Admitting: Family Medicine

## 2022-04-06 DIAGNOSIS — E041 Nontoxic single thyroid nodule: Secondary | ICD-10-CM

## 2022-04-07 ENCOUNTER — Other Ambulatory Visit: Payer: Self-pay | Admitting: Family Medicine

## 2022-04-07 DIAGNOSIS — E041 Nontoxic single thyroid nodule: Secondary | ICD-10-CM

## 2022-04-14 ENCOUNTER — Inpatient Hospital Stay: Admission: RE | Admit: 2022-04-14 | Payer: 59 | Source: Ambulatory Visit

## 2022-05-13 ENCOUNTER — Inpatient Hospital Stay: Admission: RE | Admit: 2022-05-13 | Payer: 59 | Source: Ambulatory Visit

## 2022-08-05 ENCOUNTER — Ambulatory Visit
Admission: RE | Admit: 2022-08-05 | Discharge: 2022-08-05 | Disposition: A | Discharge status: Home / Self Care | Payer: 59 | Source: Ambulatory Visit

## 2022-08-05 ENCOUNTER — Other Ambulatory Visit: Payer: 59

## 2022-08-05 ENCOUNTER — Ambulatory Visit
Admission: RE | Admit: 2022-08-05 | Discharge: 2022-08-05 | Disposition: A | Discharge status: Home / Self Care | Payer: 59 | Source: Ambulatory Visit | Attending: Obstetrics and Gynecology | Admitting: Obstetrics and Gynecology

## 2022-08-05 ENCOUNTER — Other Ambulatory Visit (HOSPITAL_COMMUNITY)
Admission: RE | Admit: 2022-08-05 | Discharge: 2022-08-05 | Disposition: A | Discharge status: Home / Self Care | Payer: 59 | Source: Ambulatory Visit | Attending: Physician Assistant | Admitting: Physician Assistant

## 2022-08-05 DIAGNOSIS — R896 Abnormal cytological findings in specimens from other organs, systems and tissues: Secondary | ICD-10-CM | POA: Diagnosis not present

## 2022-08-05 DIAGNOSIS — E041 Nontoxic single thyroid nodule: Secondary | ICD-10-CM

## 2022-08-07 LAB — CYTOLOGY - NON PAP

## 2022-08-17 ENCOUNTER — Encounter (HOSPITAL_COMMUNITY): Payer: Self-pay
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
